# Patient Record
Sex: Female | Born: 1965 | Race: White | Hispanic: No | Smoking: Never smoker
Health system: Southern US, Community
[De-identification: ages and names within clinical notes are randomized; demographics above are authoritative.]

---

## 1997-09-28 ENCOUNTER — Other Ambulatory Visit: Admission: RE | Admit: 1997-09-28 | Discharge: 1997-09-28 | Payer: Self-pay | Admitting: Obstetrics and Gynecology

## 1998-10-08 ENCOUNTER — Other Ambulatory Visit: Admission: RE | Admit: 1998-10-08 | Discharge: 1998-10-08 | Payer: Self-pay | Admitting: Obstetrics and Gynecology

## 1999-09-02 ENCOUNTER — Other Ambulatory Visit: Admission: RE | Admit: 1999-09-02 | Discharge: 1999-09-02 | Payer: Self-pay | Admitting: Obstetrics and Gynecology

## 2000-10-08 ENCOUNTER — Other Ambulatory Visit: Admission: RE | Admit: 2000-10-08 | Discharge: 2000-10-08 | Payer: Self-pay | Admitting: Obstetrics and Gynecology

## 2001-11-05 ENCOUNTER — Other Ambulatory Visit: Admission: RE | Admit: 2001-11-05 | Discharge: 2001-11-05 | Payer: Self-pay | Admitting: Obstetrics and Gynecology

## 2002-12-04 ENCOUNTER — Other Ambulatory Visit: Admission: RE | Admit: 2002-12-04 | Discharge: 2002-12-04 | Payer: Self-pay | Admitting: Obstetrics and Gynecology

## 2004-04-29 ENCOUNTER — Other Ambulatory Visit: Admission: RE | Admit: 2004-04-29 | Discharge: 2004-04-29 | Payer: Self-pay | Admitting: Obstetrics and Gynecology

## 2014-12-17 ENCOUNTER — Encounter (HOSPITAL_COMMUNITY): Payer: Self-pay | Admitting: *Deleted

## 2014-12-17 ENCOUNTER — Emergency Department (INDEPENDENT_AMBULATORY_CARE_PROVIDER_SITE_OTHER)
Admission: EM | Admit: 2014-12-17 | Discharge: 2014-12-17 | Disposition: A | Discharge status: Home / Self Care | Payer: 59 | Source: Home / Self Care | Attending: Family Medicine | Admitting: Family Medicine

## 2014-12-17 DIAGNOSIS — S8002XA Contusion of left knee, initial encounter: Secondary | ICD-10-CM | POA: Diagnosis not present

## 2014-12-17 DIAGNOSIS — S5002XA Contusion of left elbow, initial encounter: Secondary | ICD-10-CM
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
## 2014-12-17 NOTE — Discharge Instructions (Signed)
Ice, advil and activity as tolerated, return as needed.

## 2014-12-17 NOTE — ED Provider Notes (Signed)
CSN: [id number]     Arrival date & time 12/17/14  1520 History   First MD Initiated Contact with Patient 12/17/14 1616     Chief Complaint  Patient presents with  . Fall   (Consider location/radiation/quality/duration/timing/severity/associated sxs/prior Treatment) Patient is a 49 y.o. female presenting with fall. The history is provided by the patient.  Fall This is a new problem. The current episode started 3 to 5 hours ago. The problem has not changed since onset.Pertinent negatives include no chest pain, no abdominal pain and no headaches. Associated symptoms comments: Contusion of left knee and elbow soreness., no skin tears or effusions..    History reviewed. No pertinent past medical history. History reviewed. No pertinent past surgical history. No family history on file. Social History  Substance Use Topics  . Smoking status: Never Smoker   . Smokeless tobacco: None  . Alcohol Use: Yes     Comment: socially   OB History    No data available     Review of Systems  Cardiovascular: Negative for chest pain.  Gastrointestinal: Negative for abdominal pain.  Musculoskeletal: Negative for back pain, joint swelling and gait problem.  Skin: Positive for wound.  Neurological: Negative for headaches.  All other systems reviewed and are negative.   Allergies  Review of patient's allergies indicates no known allergies.  Home Medications   Prior to Admission medications   Not on File   Meds Ordered and Administered this Visit  Medications - No data to display  BP 121/77 mmHg  Pulse 82  Temp(Src) 98.6 F (37 C) (Oral)  Resp 16  SpO2 100%  LMP 11/29/2014 No data found.   Physical Exam  Constitutional: She is oriented to person, place, and time. She appears well-developed and well-nourished. No distress.  HENT:  Head: Normocephalic and atraumatic.  Neck: Normal range of motion. Neck supple.  Musculoskeletal: Normal range of motion. She exhibits tenderness.   Neurological: She is alert and oriented to person, place, and time.  Skin: Skin is warm and dry. There is erythema.  Minor erythema abrasions, no bleeding or fb, full active rom of elbow and knee.  Nursing note and vitals reviewed.   ED Course  Procedures (including critical care time)  Labs Review Labs Reviewed - No data to display  Imaging Review No results found.   Visual Acuity Review  Right Eye Distance:   Left Eye Distance:   Bilateral Distance:    Right Eye Near:   Left Eye Near:    Bilateral Near:         MDM   1. Knee contusion, left, initial encounter   2. Elbow contusion, left, initial encounter        Billy Fischer, MD 12/17/14 [phone]

## 2016-05-03 ENCOUNTER — Telehealth: Payer: 59 | Admitting: Nurse Practitioner

## 2016-05-03 DIAGNOSIS — J329 Chronic sinusitis, unspecified: Secondary | ICD-10-CM | POA: Diagnosis not present

## 2016-05-03 DIAGNOSIS — B9789 Other viral agents as the cause of diseases classified elsewhere: Secondary | ICD-10-CM | POA: Diagnosis not present

## 2016-05-03 MED ORDER — FLUTICASONE PROPIONATE 50 MCG/ACT NA SUSP: 2.0000 | Freq: Two times a day (BID) | NASAL | 0 refills | Status: DC

## 2016-05-03 NOTE — Progress Notes (Signed)

## 2016-08-02 DIAGNOSIS — Z1231 Encounter for screening mammogram for malignant neoplasm of breast: Secondary | ICD-10-CM | POA: Diagnosis not present

## 2016-08-29 DIAGNOSIS — Z6823 Body mass index (BMI) 23.0-23.9, adult: Secondary | ICD-10-CM | POA: Diagnosis not present

## 2016-08-29 DIAGNOSIS — Z01419 Encounter for gynecological examination (general) (routine) without abnormal findings: Secondary | ICD-10-CM | POA: Diagnosis not present

## 2016-08-30 ENCOUNTER — Other Ambulatory Visit (HOSPITAL_COMMUNITY): Payer: Self-pay | Admitting: Obstetrics and Gynecology

## 2016-08-30 DIAGNOSIS — E041 Nontoxic single thyroid nodule: Secondary | ICD-10-CM

## 2016-09-05 ENCOUNTER — Ambulatory Visit (HOSPITAL_COMMUNITY): Payer: 59

## 2016-09-07 ENCOUNTER — Ambulatory Visit (HOSPITAL_COMMUNITY): Payer: 59

## 2016-09-14 ENCOUNTER — Ambulatory Visit (HOSPITAL_COMMUNITY): Payer: 59

## 2016-09-19 ENCOUNTER — Encounter (HOSPITAL_COMMUNITY): Payer: Self-pay

## 2016-09-19 ENCOUNTER — Ambulatory Visit (HOSPITAL_COMMUNITY): Payer: 59

## 2016-11-24 ENCOUNTER — Other Ambulatory Visit: Payer: Self-pay | Admitting: Critical Care Medicine

## 2017-02-21 DIAGNOSIS — C44612 Basal cell carcinoma of skin of right upper limb, including shoulder: Secondary | ICD-10-CM | POA: Diagnosis not present

## 2017-02-21 DIAGNOSIS — D1801 Hemangioma of skin and subcutaneous tissue: Secondary | ICD-10-CM | POA: Diagnosis not present

## 2017-02-21 DIAGNOSIS — L430 Hypertrophic lichen planus: Secondary | ICD-10-CM | POA: Diagnosis not present

## 2017-02-21 DIAGNOSIS — D225 Melanocytic nevi of trunk: Secondary | ICD-10-CM | POA: Diagnosis not present

## 2017-02-21 DIAGNOSIS — L570 Actinic keratosis: Secondary | ICD-10-CM | POA: Diagnosis not present

## 2017-02-21 DIAGNOSIS — L821 Other seborrheic keratosis: Secondary | ICD-10-CM | POA: Diagnosis not present

## 2017-02-21 DIAGNOSIS — L819 Disorder of pigmentation, unspecified: Secondary | ICD-10-CM | POA: Diagnosis not present

## 2017-02-21 DIAGNOSIS — D485 Neoplasm of uncertain behavior of skin: Secondary | ICD-10-CM | POA: Diagnosis not present

## 2017-04-04 MED FILL — diazePAM 5 MG TABS: 5 | 1 days supply | Qty: 2 | Fill #0

## 2017-04-11 DIAGNOSIS — C44612 Basal cell carcinoma of skin of right upper limb, including shoulder: Secondary | ICD-10-CM | POA: Diagnosis not present

## 2017-04-11 DIAGNOSIS — Z85828 Personal history of other malignant neoplasm of skin: Secondary | ICD-10-CM | POA: Diagnosis not present

## 2017-04-11 MED FILL — DOXYCYCLINE HYCLATE 100 MG: 100 | 5 days supply | Qty: 10 | Fill #0

## 2018-04-04 DIAGNOSIS — D225 Melanocytic nevi of trunk: Secondary | ICD-10-CM | POA: Diagnosis not present

## 2018-04-04 DIAGNOSIS — L819 Disorder of pigmentation, unspecified: Secondary | ICD-10-CM | POA: Diagnosis not present

## 2018-04-04 DIAGNOSIS — L821 Other seborrheic keratosis: Secondary | ICD-10-CM | POA: Diagnosis not present

## 2018-04-04 DIAGNOSIS — L814 Other melanin hyperpigmentation: Secondary | ICD-10-CM | POA: Diagnosis not present

## 2018-04-04 DIAGNOSIS — Z85828 Personal history of other malignant neoplasm of skin: Secondary | ICD-10-CM | POA: Diagnosis not present

## 2018-04-04 DIAGNOSIS — D1801 Hemangioma of skin and subcutaneous tissue: Secondary | ICD-10-CM | POA: Diagnosis not present

## 2018-04-04 DIAGNOSIS — D2272 Melanocytic nevi of left lower limb, including hip: Secondary | ICD-10-CM | POA: Diagnosis not present

## 2018-04-04 DIAGNOSIS — L918 Other hypertrophic disorders of the skin: Secondary | ICD-10-CM | POA: Diagnosis not present

## 2018-04-04 DIAGNOSIS — D224 Melanocytic nevi of scalp and neck: Secondary | ICD-10-CM | POA: Diagnosis not present

## 2018-04-25 DIAGNOSIS — H524 Presbyopia: Secondary | ICD-10-CM | POA: Diagnosis not present

## 2018-10-23 DIAGNOSIS — Z1231 Encounter for screening mammogram for malignant neoplasm of breast: Secondary | ICD-10-CM | POA: Diagnosis not present

## 2018-10-23 DIAGNOSIS — Z6820 Body mass index (BMI) 20.0-20.9, adult: Secondary | ICD-10-CM | POA: Diagnosis not present

## 2018-10-23 DIAGNOSIS — Z01419 Encounter for gynecological examination (general) (routine) without abnormal findings: Secondary | ICD-10-CM | POA: Diagnosis not present

## 2018-10-23 DIAGNOSIS — N951 Menopausal and female climacteric states: Secondary | ICD-10-CM | POA: Diagnosis not present

## 2018-10-23 DIAGNOSIS — E041 Nontoxic single thyroid nodule: Secondary | ICD-10-CM | POA: Diagnosis not present

## 2018-10-24 ENCOUNTER — Other Ambulatory Visit: Payer: Self-pay | Admitting: Obstetrics and Gynecology

## 2018-10-24 DIAGNOSIS — E041 Nontoxic single thyroid nodule: Secondary | ICD-10-CM

## 2018-10-31 ENCOUNTER — Other Ambulatory Visit: Payer: 59

## 2018-11-01 ENCOUNTER — Ambulatory Visit
Admission: RE | Admit: 2018-11-01 | Discharge: 2018-11-01 | Disposition: A | Discharge status: Home / Self Care | Payer: 59 | Source: Ambulatory Visit | Attending: Obstetrics and Gynecology | Admitting: Obstetrics and Gynecology

## 2018-11-01 DIAGNOSIS — E041 Nontoxic single thyroid nodule: Secondary | ICD-10-CM | POA: Diagnosis not present

## 2019-10-21 MED FILL — valACYclovir HCL 1 GM TABS: 1 | 2 days supply | Qty: 8 | Fill #0

## 2019-11-20 IMAGING — US US THYROID
1 series · 13 of 25 positions shown · non-contrast
Comparison: None.

CLINICAL DATA: Nodule felt on physical exam, left

EXAM:
THYROID ULTRASOUND
TECHNIQUE: Ultrasound examination of the thyroid gland and adjacent soft
tissues was performed.

[Series 1: us thyroid · 0.03mm/px · 13 of 67 slices shown]
[im 1/67]
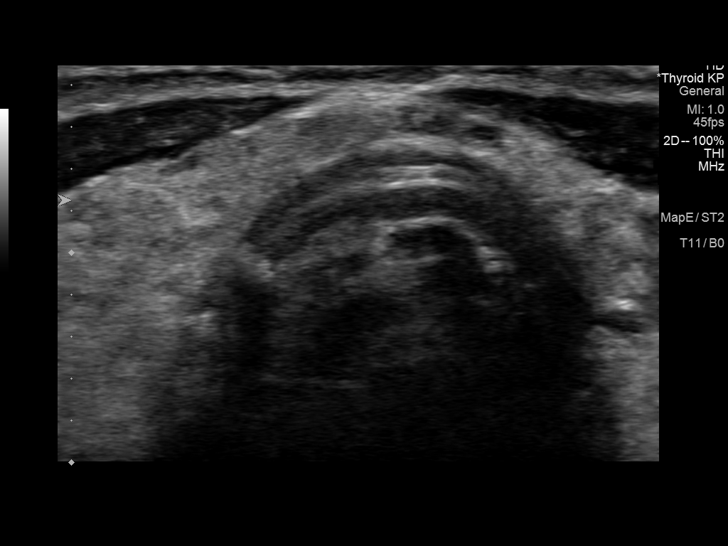
[im 6/67]
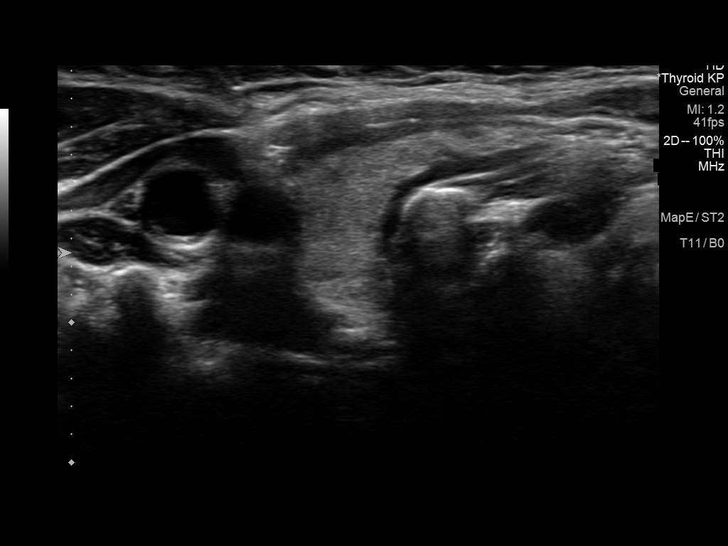
[im 12/67]
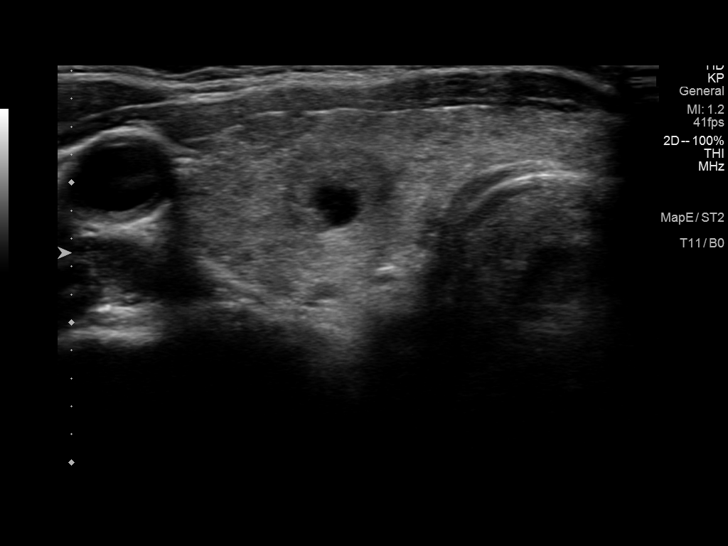
[im 17/67]
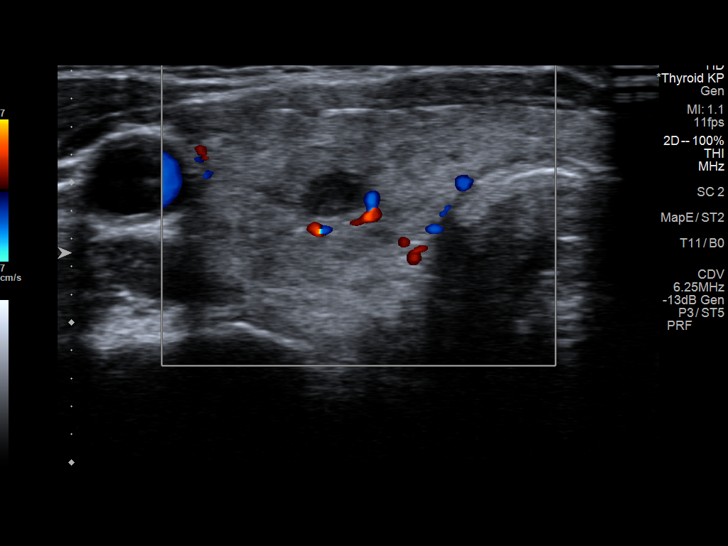
[im 23/67]
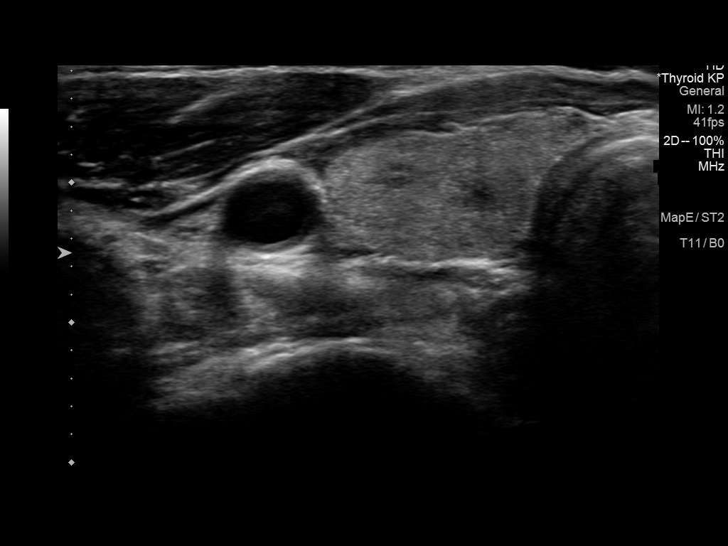
[im 28/67]
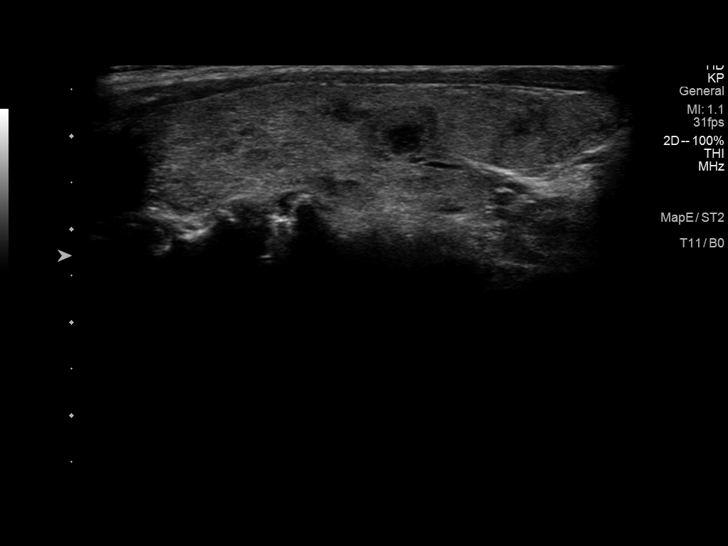
[im 34/67]
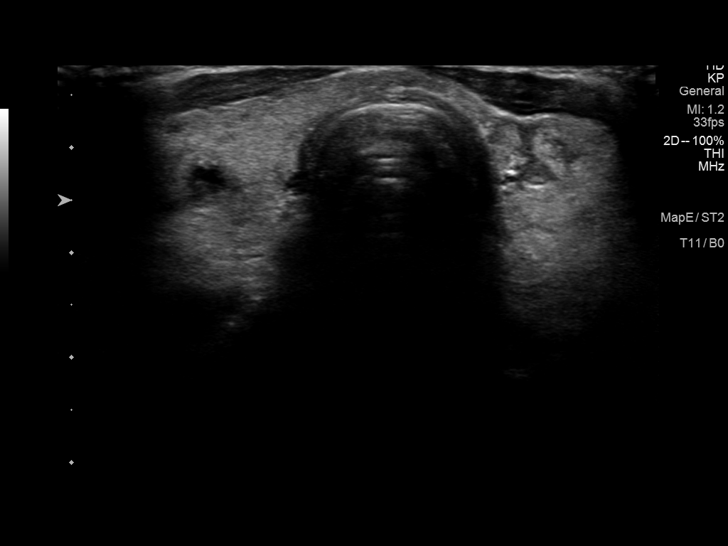
[im 39/67]
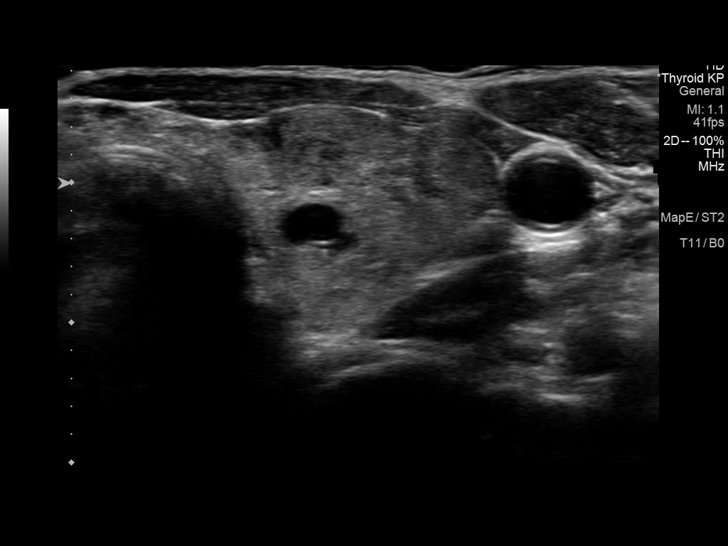
[im 45/67]
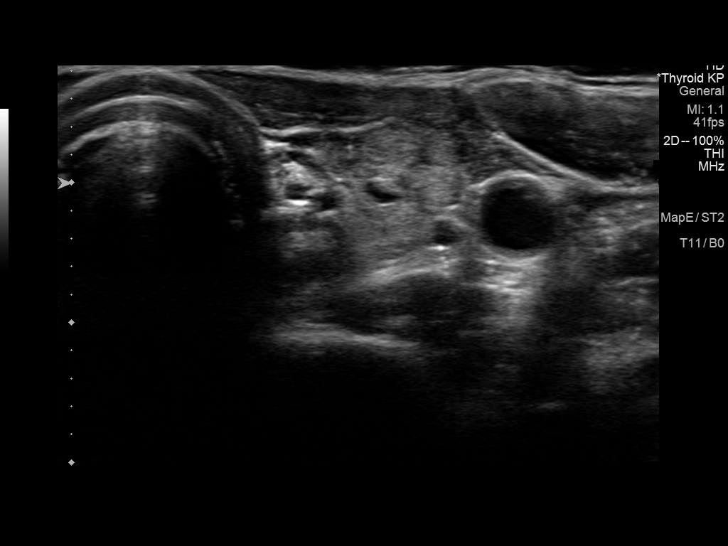
[im 50/67]
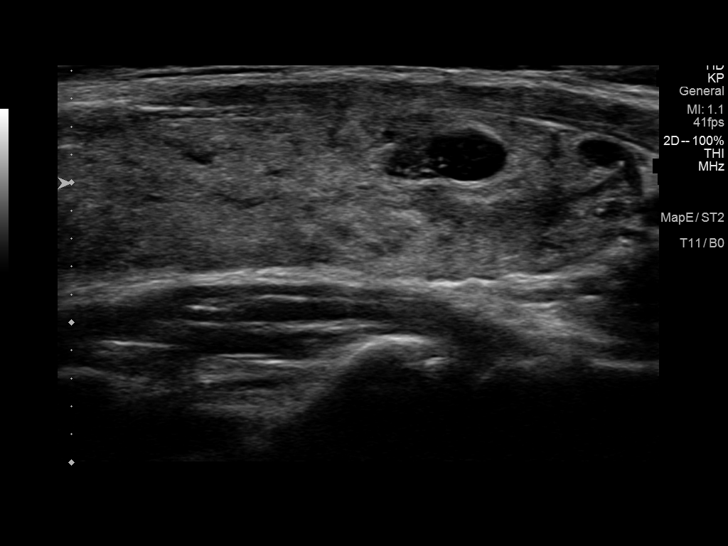
[im 56/67]
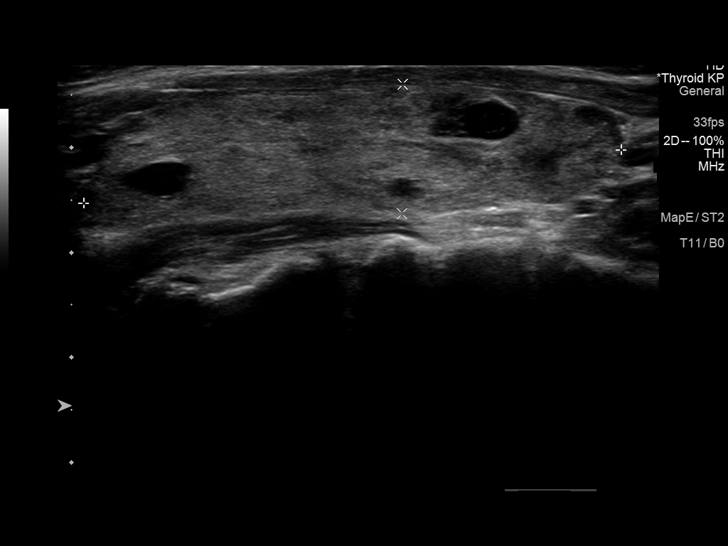
[im 61/67]
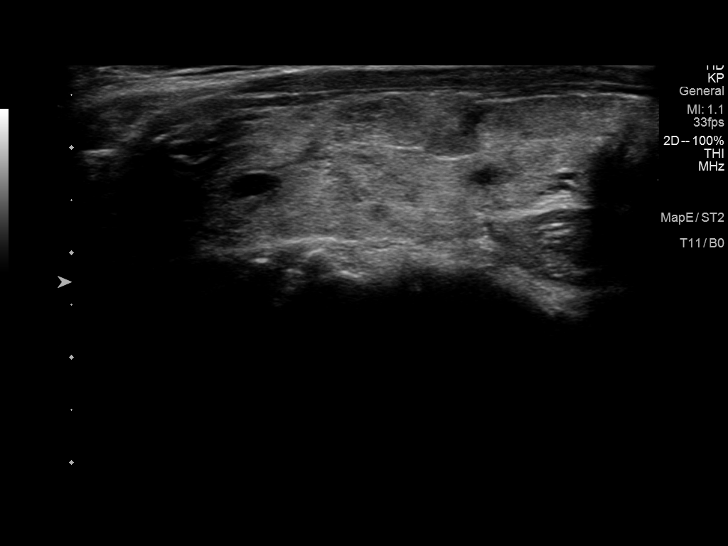
[im 67/67]
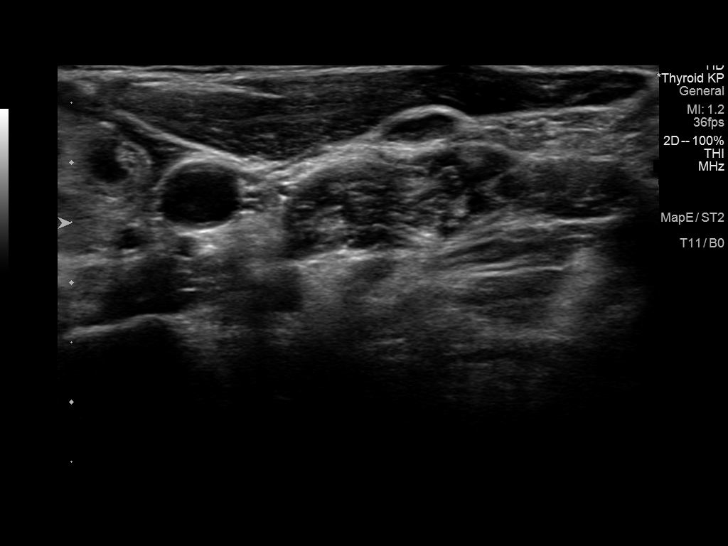

[13 of 25 positions shown; findings below may reference images not displayed]

FINDINGS: Parenchymal Echotexture: Moderately heterogenous

Isthmus: 0.3 cm thickness

Right lobe: 5.4 x 1.4 x 1.9 cm

Left lobe: 5.2 x 1.2 x 1.9 cm

_________________________________________________________

Estimated total number of nodules >/= 1 cm: 0

Number of spongiform nodules >/=  2 cm not described below (TR1): 0

Number of mixed cystic and solid nodules >/= 1.5 cm not described
below (TR2): 0

_________________________________________________________

0.8 cm mixed solid/cystic nodule mid right; This nodule does NOT
meet TI-RADS criteria for biopsy or dedicated follow-up.

0.6 cm mixed solid/cystic nodule, lateral right; This nodule does
NOT meet TI-RADS criteria for biopsy or dedicated follow-up.

0.7 cm benign colloid cyst, superior left

0.9 cm complex cyst, inferior left; This nodule does NOT meet
TI-RADS criteria for biopsy or dedicated follow-up.
IMPRESSION: 1. Borderline thyromegaly with bilateral subcentimeter complex and
cystic lesions. None meets criteria for biopsy or dedicated imaging
follow-up.

The above is in keeping with the ACR TI-RADS recommendations - [HOSPITAL] 7403;[DATE].

## 2019-12-08 DIAGNOSIS — H524 Presbyopia: Secondary | ICD-10-CM | POA: Diagnosis not present

## 2020-01-11 ENCOUNTER — Emergency Department (HOSPITAL_COMMUNITY)
Admission: EM | Admit: 2020-01-11 | Discharge: 2020-01-11 | Disposition: A | Discharge status: Home / Self Care | Payer: 59 | Attending: Emergency Medicine | Admitting: Emergency Medicine

## 2020-01-11 ENCOUNTER — Other Ambulatory Visit: Payer: Self-pay

## 2020-01-11 ENCOUNTER — Encounter (HOSPITAL_COMMUNITY): Payer: Self-pay | Admitting: *Deleted

## 2020-01-11 DIAGNOSIS — R110 Nausea: Secondary | ICD-10-CM | POA: Insufficient documentation

## 2020-01-11 DIAGNOSIS — I959 Hypotension, unspecified: Secondary | ICD-10-CM | POA: Diagnosis not present

## 2020-01-11 DIAGNOSIS — R42 Dizziness and giddiness: Secondary | ICD-10-CM | POA: Insufficient documentation

## 2020-01-11 LAB — URINALYSIS, ROUTINE W REFLEX MICROSCOPIC
Bilirubin Urine: NEGATIVE
Glucose, UA: 150 mg/dL — AB
Hgb urine dipstick: NEGATIVE
Ketones, ur: 5 mg/dL — AB
Leukocytes,Ua: NEGATIVE
Nitrite: NEGATIVE
Protein, ur: NEGATIVE mg/dL
Specific Gravity, Urine: 1.018 (ref 1.005–1.030)
pH: 5 (ref 5.0–8.0)

## 2020-01-11 LAB — CBC WITH DIFFERENTIAL/PLATELET
Abs Immature Granulocytes: 0.05 10*3/uL (ref 0.00–0.07)
Basophils Absolute: 0 10*3/uL (ref 0.0–0.1)
Basophils Relative: 0 %
Eosinophils Absolute: 0.1 10*3/uL (ref 0.0–0.5)
Eosinophils Relative: 1 %
HCT: 42 % (ref 36.0–46.0)
Hemoglobin: 13.9 g/dL (ref 12.0–15.0)
Immature Granulocytes: 1 %
Lymphocytes Relative: 13 %
Lymphs Abs: 1.3 10*3/uL (ref 0.7–4.0)
MCH: 32.3 pg (ref 26.0–34.0)
MCHC: 33.1 g/dL (ref 30.0–36.0)
MCV: 97.4 fL (ref 80.0–100.0)
Monocytes Absolute: 0.9 10*3/uL (ref 0.1–1.0)
Monocytes Relative: 9 %
Neutro Abs: 7.8 10*3/uL — ABNORMAL HIGH (ref 1.7–7.7)
Neutrophils Relative %: 76 %
Platelets: 269 10*3/uL (ref 150–400)
RBC: 4.31 MIL/uL (ref 3.87–5.11)
RDW: 11.8 % (ref 11.5–15.5)
WBC: 10.2 10*3/uL (ref 4.0–10.5)
nRBC: 0 % (ref 0.0–0.2)

## 2020-01-11 LAB — COMPREHENSIVE METABOLIC PANEL
ALT: 18 U/L (ref 0–44)
AST: 28 U/L (ref 15–41)
Albumin: 4.7 g/dL (ref 3.5–5.0)
Alkaline Phosphatase: 70 U/L (ref 38–126)
Anion gap: 18 — ABNORMAL HIGH (ref 5–15)
BUN: 16 mg/dL (ref 6–20)
CO2: 19 mmol/L — ABNORMAL LOW (ref 22–32)
Calcium: 9.6 mg/dL (ref 8.9–10.3)
Chloride: 101 mmol/L (ref 98–111)
Creatinine, Ser: 0.78 mg/dL (ref 0.44–1.00)
GFR, Estimated: >60 mL/min (ref >60)
Glucose, Bld: 211 mg/dL — ABNORMAL HIGH (ref 70–99)
Potassium: 3.2 mmol/L — ABNORMAL LOW (ref 3.5–5.1)
Sodium: 138 mmol/L (ref 135–145)
Total Bilirubin: 1.4 mg/dL — ABNORMAL HIGH (ref 0.3–1.2)
Total Protein: 7.2 g/dL (ref 6.5–8.1)

## 2020-01-11 LAB — PREGNANCY, URINE: Preg Test, Ur: NEGATIVE

## 2020-01-11 LAB — CBG MONITORING, ED: Glucose-Capillary: 138 mg/dL — ABNORMAL HIGH (ref 70–99)

## 2020-01-11 MED ORDER — MECLIZINE HCL 25 MG PO TABS
25.0000 mg | ORAL_TABLET | Freq: Once | ORAL | Status: AC
  Administered 2020-01-11: 25 mg via ORAL
  Filled 2020-01-11: qty 1

## 2020-01-11 MED ORDER — MECLIZINE HCL 25 MG PO TABS: 25.0000 mg | ORAL_TABLET | Freq: Three times a day (TID) | ORAL | 0 refills | Status: AC | PRN

## 2020-01-11 MED ORDER — SODIUM CHLORIDE 0.9 % IV BOLUS
1000.0000 mL | Freq: Once | INTRAVENOUS | Status: AC
  Administered 2020-01-11: 1000 mL via INTRAVENOUS

## 2020-01-11 NOTE — ED Triage Notes (Addendum)
Pt BIB EMS and coming from home. Pt had acute sudden onset dizziness/nausea. On EMS arrival, pt was found naked on the floor with on ice on her head.  Pt reported to EMS that she got hot and needed to take her clothes off. Pt denies any other complaints. Pt reports changes in movement causes dizziness. EMS witnessed an episode of emesis. 4mg  IV zofran given.

## 2020-01-11 NOTE — ED Notes (Signed)
Pt aware of need for a urine sample. 

## 2020-01-11 NOTE — ED Provider Notes (Signed)
[facility] Provider Note  CSN: [id number] Arrival date & time: 01/11/20 1611    History Chief Complaint  Patient presents with  . Dizziness    HPI  Lori Ochoa is a 54 y.o. female with no significant PMH has been in her normal state of health recently. Around 1pm today she reports sudden onset of room-spinning dizziness, associated with nausea, vomiting and diaphoresis. She was worse with standing and had to lie down on the floor where EMS found her. They gave her Zofran IV with some improvement in nausea. She denies any recent URI symptoms. No CP, SOB, fever, diarrhea or dysuria.    History reviewed. No pertinent past medical history.  History reviewed. No pertinent surgical history.  No family history on file.  Social History   Tobacco Use  . Smoking status: Never Smoker  . Smokeless tobacco: Never Used  Vaping Use  . Vaping Use: Never used  Substance Use Topics  . Alcohol use: Yes    Comment: socially  . Drug use: Not on file     Home Medications Prior to Admission medications   Medication Sig Start Date End Date Taking? Authorizing Provider  Ascorbic Acid (VITAMIN C PO) Take 1 tablet by mouth daily with breakfast.   Yes [provider]  B Complex Vitamins (VITAMIN B COMPLEX) TABS Take 1 tablet by mouth daily with breakfast.   Yes [provider]  Cholecalciferol (VITAMIN D3 PO) Take 1 tablet by mouth daily with breakfast.   Yes [provider]  Flaxseed, Linseed, (FLAXSEED OIL PO) Take 1 capsule by mouth daily with breakfast.   Yes [provider]  MAGNESIUM PO Take 1 tablet by mouth daily with breakfast.   Yes [provider]  meclizine (ANTIVERT) 25 MG tablet Take 1 tablet (25 mg total) by mouth 3 (three) times daily as needed for dizziness. 01/11/20   Truddie Hidden, MD     Allergies    Patient has no known allergies.   Review of Systems   Review of Systems A comprehensive review  of systems was completed and negative except as noted in HPI.    Physical Exam BP 112/78   Pulse 76   Temp 97.7 F (36.5 C) (Oral)   Resp 15   SpO2 99%   Physical Exam Vitals and nursing note reviewed.  Constitutional:      Appearance: Normal appearance.  HENT:     Head: Normocephalic and atraumatic.     Nose: Nose normal.     Mouth/Throat:     Mouth: Mucous membranes are dry.  Eyes:     Extraocular Movements: Extraocular movements intact.     Conjunctiva/sclera: Conjunctivae normal.  Cardiovascular:     Rate and Rhythm: Normal rate.  Pulmonary:     Effort: Pulmonary effort is normal.     Breath sounds: Normal breath sounds.  Abdominal:     General: Abdomen is flat.     Palpations: Abdomen is soft.     Tenderness: There is no abdominal tenderness.  Musculoskeletal:        General: No swelling. Normal range of motion.     Cervical back: Neck supple.  Skin:    General: Skin is warm and dry.  Neurological:     General: No focal deficit present.     Mental Status: She is alert and oriented to person, place, and time.     Cranial Nerves: No cranial nerve deficit.     Sensory: No  sensory deficit.     Motor: No weakness.     Coordination: Coordination normal.     Comments: Symptoms improved in semirecumbent position, no nystagmus with lateral gaze  Psychiatric:        Mood and Affect: Mood normal.      ED Results / Procedures / Treatments   Labs (all labs ordered are listed, but only abnormal results are displayed) Labs Reviewed  COMPREHENSIVE METABOLIC PANEL - Abnormal; Notable for the following components:      Result Value   Potassium 3.2 (*)    CO2 19 (*)    Glucose, Bld 211 (*)    Total Bilirubin 1.4 (*)    Anion gap 18 (*)    All other components within normal limits  CBC WITH DIFFERENTIAL/PLATELET - Abnormal; Notable for the following components:   Neutro Abs 7.8 (*)    All other components within normal limits  URINALYSIS, ROUTINE W REFLEX  MICROSCOPIC - Abnormal; Notable for the following components:   APPearance HAZY (*)    Glucose, UA 150 (*)    Ketones, ur 5 (*)    All other components within normal limits  CBG MONITORING, ED - Abnormal; Notable for the following components:   Glucose-Capillary 138 (*)    All other components within normal limits  PREGNANCY, URINE    EKG EKG Interpretation  Date/Time:  "Sunday January 11 2020 16:41:28 EDT Ventricular Rate:  76 PR Interval:    QRS Duration: 100 QT Interval:  422 QTC Calculation: 475 R Axis:   84 Text Interpretation: Sinus rhythm Normal ECG No old tracing to compare Confirmed by Doug Bucklin ([id number]) on 01/11/2020 4:44:25 PM   Radiology No results found.  Procedures Procedures  Medications Ordered in the ED Medications  sodium chloride 0.9 % bolus 1,000 mL (1,000 mLs Intravenous New Bag/Given 01/11/20 1650)  meclizine (ANTIVERT) tablet 25 mg (25 mg Oral Given 01/11/20 1642)     MDM Rules/Calculators/A&P MDM Patient's symptoms most consistent with peripheral vertigo. Will check basic labs, give IVF, Meclizine and reassess.  ED Course  I have reviewed the triage vital signs and the nursing notes.  Pertinent labs & imaging results that were available during my care of the patient were reviewed by me and considered in my medical decision making (see chart for details).  Clinical Course as of Jan 10 1913  Sun Jan 11, 2020  1709 CBC is normal.    [CS]  1730 CMP with mild hyperglycemia and mild hypokalemia of unclear significance. No prior history of DM.    [CS]  1834 Patient able to walk to bathroom without significant dizziness, feeling better. Will check CBG when IVF are done to assess glucose improvement.    [CS]  1855 CBG improving, likely a transient finding from stress reaction.    [CS]  1909 Patient remains asymptomatic and ready to go home. UA is unremarkable. Will give Rx for Meclizine as needed. Given general information regarding BPV. ENT  referral if symptoms persistent and RTED for any other concerns.    [CS]    Clinical Course User Index [CS] Dvid Pendry B, MD    Final Clinical Impression(s) / ED Diagnoses Final diagnoses:  Vertigo    Rx / DC Orders ED Discharge Orders         Ordered    meclizine (ANTIVERT) 25 MG tablet  3 times daily PRN        10" /24/21 1913  Truddie Hidden, MD 01/11/20 [phone]

## 2020-01-11 NOTE — ED Notes (Signed)
An After Visit Summary was printed and given to the patient. Discharge instructions given and no further questions at this time.  Pt leaving with husband. Pt ambulatory.

## 2020-01-31 ENCOUNTER — Other Ambulatory Visit (HOSPITAL_COMMUNITY): Payer: Self-pay | Admitting: Internal Medicine

## 2020-01-31 MED FILL — FLUARIX QUADRIVALENT 0.5 ML: 0.5 | 1 days supply | Qty: 1 | Fill #0

## 2020-08-03 DIAGNOSIS — Z01419 Encounter for gynecological examination (general) (routine) without abnormal findings: Secondary | ICD-10-CM | POA: Diagnosis not present

## 2020-08-03 DIAGNOSIS — Z1231 Encounter for screening mammogram for malignant neoplasm of breast: Secondary | ICD-10-CM | POA: Diagnosis not present

## 2020-08-03 DIAGNOSIS — Z681 Body mass index (BMI) 19 or less, adult: Secondary | ICD-10-CM | POA: Diagnosis not present

## 2020-08-04 ENCOUNTER — Other Ambulatory Visit (HOSPITAL_COMMUNITY): Payer: Self-pay | Admitting: Obstetrics and Gynecology

## 2020-08-04 ENCOUNTER — Other Ambulatory Visit: Payer: Self-pay | Admitting: Obstetrics and Gynecology

## 2020-08-04 DIAGNOSIS — E041 Nontoxic single thyroid nodule: Secondary | ICD-10-CM

## 2021-11-10 DIAGNOSIS — L821 Other seborrheic keratosis: Secondary | ICD-10-CM | POA: Diagnosis not present

## 2021-11-10 DIAGNOSIS — L814 Other melanin hyperpigmentation: Secondary | ICD-10-CM | POA: Diagnosis not present

## 2021-11-10 DIAGNOSIS — Z85828 Personal history of other malignant neoplasm of skin: Secondary | ICD-10-CM | POA: Diagnosis not present

## 2021-11-10 DIAGNOSIS — L820 Inflamed seborrheic keratosis: Secondary | ICD-10-CM | POA: Diagnosis not present

## 2021-11-10 DIAGNOSIS — L570 Actinic keratosis: Secondary | ICD-10-CM | POA: Diagnosis not present

## 2022-10-05 DIAGNOSIS — Z681 Body mass index (BMI) 19 or less, adult: Secondary | ICD-10-CM | POA: Diagnosis not present

## 2022-10-05 DIAGNOSIS — Z01419 Encounter for gynecological examination (general) (routine) without abnormal findings: Secondary | ICD-10-CM | POA: Diagnosis not present

## 2022-10-05 DIAGNOSIS — Z1231 Encounter for screening mammogram for malignant neoplasm of breast: Secondary | ICD-10-CM | POA: Diagnosis not present

## 2022-10-06 ENCOUNTER — Other Ambulatory Visit: Payer: Self-pay | Admitting: Obstetrics and Gynecology

## 2022-10-06 DIAGNOSIS — E041 Nontoxic single thyroid nodule: Secondary | ICD-10-CM

## 2022-10-18 ENCOUNTER — Ambulatory Visit
Admission: RE | Admit: 2022-10-18 | Discharge: 2022-10-18 | Disposition: A | Discharge status: Home / Self Care | Payer: Commercial Managed Care - PPO | Source: Ambulatory Visit | Attending: Obstetrics and Gynecology | Admitting: Obstetrics and Gynecology

## 2022-10-18 DIAGNOSIS — E041 Nontoxic single thyroid nodule: Secondary | ICD-10-CM

## 2022-12-12 DIAGNOSIS — E041 Nontoxic single thyroid nodule: Secondary | ICD-10-CM | POA: Diagnosis not present

## 2023-02-26 DIAGNOSIS — D2261 Melanocytic nevi of right upper limb, including shoulder: Secondary | ICD-10-CM | POA: Diagnosis not present

## 2023-02-26 DIAGNOSIS — L570 Actinic keratosis: Secondary | ICD-10-CM | POA: Diagnosis not present

## 2023-02-26 DIAGNOSIS — Z85828 Personal history of other malignant neoplasm of skin: Secondary | ICD-10-CM | POA: Diagnosis not present

## 2023-02-26 DIAGNOSIS — D2262 Melanocytic nevi of left upper limb, including shoulder: Secondary | ICD-10-CM | POA: Diagnosis not present

## 2023-02-26 DIAGNOSIS — D225 Melanocytic nevi of trunk: Secondary | ICD-10-CM | POA: Diagnosis not present

## 2023-02-26 DIAGNOSIS — L814 Other melanin hyperpigmentation: Secondary | ICD-10-CM | POA: Diagnosis not present

## 2023-02-26 DIAGNOSIS — D1801 Hemangioma of skin and subcutaneous tissue: Secondary | ICD-10-CM | POA: Diagnosis not present

## 2023-02-26 DIAGNOSIS — L821 Other seborrheic keratosis: Secondary | ICD-10-CM | POA: Diagnosis not present

## 2023-03-05 ENCOUNTER — Encounter: Payer: Self-pay | Admitting: "Endocrinology

## 2023-03-05 ENCOUNTER — Ambulatory Visit: Payer: Commercial Managed Care - PPO | Admitting: "Endocrinology

## 2023-03-05 VITALS — BP 110/78 | HR 80 | Ht 62.75 in | Wt 109.4 lb

## 2023-03-05 DIAGNOSIS — E042 Nontoxic multinodular goiter: Secondary | ICD-10-CM | POA: Diagnosis not present

## 2023-03-05 NOTE — Progress Notes (Signed)
Endocrinology Consult Note                                            03/05/2023, 4:57 PM   Subjective:    Patient ID: Lori Ochoa, female    DOB: 1966-03-07, PCP Richardean Chimera, MD   History reviewed. No pertinent past medical history. History reviewed. No pertinent surgical history. Social History   Socioeconomic History   Marital status: Media planner    Spouse name: Not on file   Number of children: Not on file   Years of education: Not on file   Highest education level: Not on file  Occupational History   Not on file  Tobacco Use   Smoking status: Never   Smokeless tobacco: Never  Vaping Use   Vaping status: Never Used  Substance and Sexual Activity   Alcohol use: Yes    Comment: socially   Drug use: Not on file   Sexual activity: Not on file  Other Topics Concern   Not on file  Social History Narrative   Not on file   Social Drivers of Health   Financial Resource Strain: Not on file  Food Insecurity: Not on file  Transportation Needs: Not on file  Physical Activity: Not on file  Stress: Not on file  Social Connections: Not on file   History reviewed. No pertinent family history. Outpatient Encounter Medications as of 03/05/2023  Medication Sig   valACYclovir (VALTREX) 500 MG tablet Take 500 mg by mouth 2 (two) times daily as needed.   Ascorbic Acid (VITAMIN C PO) Take 1 tablet by mouth daily with breakfast.   B Complex Vitamins (VITAMIN B COMPLEX) TABS Take 1 tablet by mouth daily with breakfast.   Cholecalciferol (VITAMIN D3 PO) Take 1 tablet by mouth daily with breakfast.   MAGNESIUM PO Take 1 tablet by mouth daily with breakfast.   meclizine (ANTIVERT) 25 MG tablet Take 1 tablet (25 mg total) by mouth 3 (three) times daily as needed for dizziness.   [DISCONTINUED] Flaxseed, Linseed, (FLAXSEED OIL PO) Take 1 capsule by mouth daily with breakfast.   No facility-administered encounter medications on file as of 03/05/2023.   ALLERGIES: No  Known Allergies  VACCINATION STATUS: Immunization History  Administered Date(s) Administered   Influenza,inj,Quad PF,6+ Mos 01/31/2020    HPI Skylinn Ochoa is 57 y.o. female who presents today with a medical history as above. she is being seen in consultation for multinodular goiter requested by Richardean Chimera, MD.   History is obtained directly from the patient as well as chart review.  Patient is interested in lifestyle medicine. On physical exam in the summer she was found to have clinical goiter.  On October 18, 2022 she underwent dedicated thyroid ultrasound measuring 5.9 cm on the right and 5.7 cm on the left.  There was 1 nodule on the right and 2 nodules in the left described as mixed cystic and solid nodules with no concerning features.  She did not undergo any fine-needle aspiration biopsy.  She does not have recent thyroid function tests.  Patient denies medical history of diabetes, hypertension, hyperlipidemia, coronary artery disease.  She is interested to discuss lifestyle medicine for preventive medicine purposes.  She is on vitamin C, vitamin B complex, vitamin D, magnesium, meclizine, and Valtrex 500 mg tablets as needed. She reports regular exercise as a  habit.  She is not following any particular dietary pattern. She has always been like to build.  She does not have acute complaints today.  Her BMI is 19.53. Patient is a non-smoker, social alcohol user.   Review of Systems  Constitutional: +mildly fluctuating body weight no recent major weight change.  no fatigue, no subjective hyperthermia, no subjective hypothermia Eyes: no blurry vision, no xerophthalmia ENT: no sore throat, no nodules palpated in throat, no dysphagia/odynophagia, no hoarseness Cardiovascular: no Chest Pain, no Shortness of Breath, no palpitations, no leg swelling Respiratory: no cough, no shortness of breath Gastrointestinal: no Nausea/Vomiting/Diarhhea Musculoskeletal: no muscle/joint aches Skin: no  rashes Neurological: no tremors, no numbness, no tingling, no dizziness Psychiatric: no depression, no anxiety  Objective:       03/05/2023    2:16 PM 01/11/2020    7:30 PM 01/11/2020    7:00 PM  Vitals with BMI  Height 5' 2.75"    Weight 109 lbs 6 oz    BMI 19.53    Systolic 110 123 542  Diastolic 78 82 78  Pulse 80 80 76    BP 110/78   Pulse 80   Ht 5' 2.75" (1.594 m)   Wt 109 lb 6.4 oz (49.6 kg)   BMI 19.53 kg/m   Wt Readings from Last 3 Encounters:  03/05/23 109 lb 6.4 oz (49.6 kg)    Physical Exam  Constitutional:  Body mass index is 19.53 kg/m.,  not in acute distress, normal state of mind Eyes: PERRLA, EOMI, no exophthalmos ENT: moist mucous membranes, + gross thyromegaly, no gross cervical lymphadenopathy Cardiovascular: normal precordial activity, Regular Rate and Rhythm, no Murmur/Rubs/Gallops Respiratory:  adequate breathing efforts, no gross chest deformity, Clear to auscultation bilaterally Gastrointestinal: abdomen soft, Non -tender, No distension, Bowel Sounds present, no gross organomegaly Musculoskeletal: no gross deformities, strength intact in all four extremities, no peripheral edema Skin: moist, warm, no rashes Neurological: no tremor with outstretched hands, Deep tendon reflexes normal in bilateral lower extremities.  CMP ( most recent) CMP     Component Value Date/Time   NA 138 01/11/2020 1652   K 3.2 (L) 01/11/2020 1652   CL 101 01/11/2020 1652   CO2 19 (L) 01/11/2020 1652   GLUCOSE 211 (H) 01/11/2020 1652   BUN 16 01/11/2020 1652   CREATININE 0.78 01/11/2020 1652   CALCIUM 9.6 01/11/2020 1652   PROT 7.2 01/11/2020 1652   ALBUMIN 4.7 01/11/2020 1652   AST 28 01/11/2020 1652   ALT 18 01/11/2020 1652   ALKPHOS 70 01/11/2020 1652   BILITOT 1.4 (H) 01/11/2020 1652   GFRNONAA >60 01/11/2020 1652   October 18, 2022 thyroid ultrasound:  Right lobe 5.9  cm x 1.7 cm x 2 cm Left lobe: 5.7 cm x 2.1 cm x 2.8 cm Nodule 1 in the right lobe  cystic characteristics and does not meet criteria for surveillance.  Nodule to left superior has cystic characteristics and does not meet criteria for surveillance.  Nodule is 3 left inferior thyroid has TR 2/cystic characteristics and does not meet criteria for surveillance.  Assessment & Plan:   1. Multinodular goiter (Primary)  2.  Lifestyle medicine for preventive measures   - Lerae Counce  is being seen at a kind request of Richardean Chimera, MD. - I have reviewed her available  records and clinically evaluated the patient. - Based on these reviews, she has multinodular goiter with nodules described as low risk,  however,  there is not sufficient information to  proceed with definitive treatment plan. She will need complete set of thyroid function test including TSH, free T4/free T3 and antithyroid antibodies.  Regarding her desire to discuss lifestyle medicine, this is entirely for preventive medicine purposes. - she acknowledges that there is a room for improvement in her food and drink choices. - Suggestion is made for her to avoid simple carbohydrates  from her diet including Cakes, Sweet Desserts, Ice Cream, Soda (diet and regular), Sweet Tea, Candies, Chips, Cookies, Store Bought Juices, Alcohol , Artificial Sweeteners,  Coffee Creamer, and "Sugar-free" Products, Lemonade. This will help patient to have more stable blood glucose profile and potentially avoid unintended weight gain.  The following Lifestyle Medicine recommendations according to American College of Lifestyle Medicine  [facility]) were discussed and and offered to patient and she  agrees to start the journey:  A. Whole Foods, Plant-Based Nutrition comprising of fruits and vegetables, plant-based proteins, whole-grain carbohydrates was discussed in detail with the patient.   A list for source of those nutrients were also provided to the patient.  Patient will use only water or unsweetened tea for hydration. B.  The need to stay away  from risky substances including alcohol, smoking; obtaining 7 to 9 hours of restorative sleep, at least 150 minutes of moderate intensity exercise weekly, the importance of healthy social connections,  and stress management techniques were discussed. C.  A full color page of  Calorie density of various food groups per pound showing examples of each food groups was provided to the patient.  In addition to the above labs, she will have the following additional labs before her next visit in 3 weeks. - Lipid panel - Comprehensive metabolic panel - VITAMIN D 25 Hydroxy (Vit-D Deficiency, Fractures) - Vitamin B12  - she is advised to maintain close follow up with Richardean Chimera, MD for primary care needs.   -Thank you for involving me in the care of this pleasant patient.  Time spent with the patient: 46  minutes, of which >50% was spent in  counseling her about her multinodular goiter, lifestyle medicine and the rest in obtaining information about her symptoms, reviewing her previous labs/studies ( including abstractions from other facilities),  evaluations, and treatments,  and developing a plan to confirm diagnosis and long term treatment based on the latest standards of care/guidelines; and documenting her care. Risk reduction counseling performed per USPSTF guidelines to reduce cardiovascular risk factors.    Lamara Nissen participated in the discussions, expressed understanding, and voiced agreement with the above plans.  All questions were answered to her satisfaction. she is encouraged to contact clinic should she have any questions or concerns prior to her return visit.  Follow up plan: Return in about 3 weeks (around 03/26/2023) for Fasting Labs  in AM B4 8, A1c -[place].   Marquis Lunch, MD [facility] [address] [place], [place] [address] Phone: [phone]  Fax: [phone]     03/05/2023, 4:57 PM  This note was partially dictated  with voice recognition software. Similar sounding words can be transcribed inadequately or may not  be corrected upon review.

## 2023-04-04 ENCOUNTER — Ambulatory Visit: Payer: Commercial Managed Care - PPO | Admitting: "Endocrinology

## 2023-04-05 ENCOUNTER — Ambulatory Visit: Payer: Commercial Managed Care - PPO | Admitting: "Endocrinology

## 2023-07-02 ENCOUNTER — Other Ambulatory Visit (HOSPITAL_BASED_OUTPATIENT_CLINIC_OR_DEPARTMENT_OTHER): Payer: Self-pay

## 2023-07-02 MED ORDER — VALACYCLOVIR HCL 1 G PO TABS
2000.0000 mg | ORAL_TABLET | Freq: Two times a day (BID) | ORAL | 0 refills | Status: AC
  Filled 2023-07-02: qty 4, 1d supply, fill #0

## 2023-07-03 ENCOUNTER — Other Ambulatory Visit (HOSPITAL_BASED_OUTPATIENT_CLINIC_OR_DEPARTMENT_OTHER): Payer: Self-pay

## 2023-09-12 DIAGNOSIS — Z0100 Encounter for examination of eyes and vision without abnormal findings: Secondary | ICD-10-CM | POA: Diagnosis not present

## 2024-02-27 DIAGNOSIS — L814 Other melanin hyperpigmentation: Secondary | ICD-10-CM | POA: Diagnosis not present

## 2024-02-27 DIAGNOSIS — D225 Melanocytic nevi of trunk: Secondary | ICD-10-CM | POA: Diagnosis not present

## 2024-02-27 DIAGNOSIS — L821 Other seborrheic keratosis: Secondary | ICD-10-CM | POA: Diagnosis not present

## 2024-02-27 DIAGNOSIS — Z85828 Personal history of other malignant neoplasm of skin: Secondary | ICD-10-CM | POA: Diagnosis not present

## 2024-02-27 DIAGNOSIS — D1801 Hemangioma of skin and subcutaneous tissue: Secondary | ICD-10-CM | POA: Diagnosis not present
